# Patient Record
Sex: Male | Born: 2000
Health system: Southern US, Community
[De-identification: ages and names within clinical notes are randomized; demographics above are authoritative.]

---
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
## 2022-03-19 ENCOUNTER — Telehealth: Payer: Self-pay | Admitting: Sports Medicine

## 2022-03-19 NOTE — Telephone Encounter (Signed)
 I saw Harold Whitaker's sister today with his mother, [place] is starting college and will need meningitis B #1, and then meningitis B number two 1 month later, these can be done through a nurse visit. He is up-to-date on meningitis A.

## 2022-03-22 ENCOUNTER — Ambulatory Visit (INDEPENDENT_AMBULATORY_CARE_PROVIDER_SITE_OTHER): Payer: 59 | Admitting: Sports Medicine

## 2022-03-22 VITALS — Temp 98.0°F

## 2022-03-22 DIAGNOSIS — Z23 Encounter for immunization: Secondary | ICD-10-CM

## 2022-03-22 DIAGNOSIS — Z111 Encounter for screening for respiratory tuberculosis: Secondary | ICD-10-CM | POA: Diagnosis not present

## 2022-03-22 NOTE — Progress Notes (Signed)
Pt here for 1st Bexsero and PPD.   Pt tolerated immunization well. He will RTC on Monday to have ppd read and in 1 month for 2nd Bexsero.

## 2022-03-25 ENCOUNTER — Ambulatory Visit (INDEPENDENT_AMBULATORY_CARE_PROVIDER_SITE_OTHER): Payer: 59 | Admitting: Family Medicine

## 2022-03-25 VITALS — BP 120/58 | HR 65 | Ht 70.0 in | Wt 179.0 lb

## 2022-03-25 DIAGNOSIS — Z111 Encounter for screening for respiratory tuberculosis: Secondary | ICD-10-CM

## 2022-03-25 LAB — TB SKIN TEST
Induration: 0 mm
TB Skin Test: NEGATIVE

## 2022-03-25 NOTE — Progress Notes (Unsigned)
   Subjective:    Patient ID: Harold Whitaker, male    DOB: Jul 10, 2001, 21 y.o.   MRN: [id number]  HPI Patient is here for a PPD read.    Review of Systems     Objective:   Physical Exam        Assessment & Plan:  Results were negative with 0 mm induration.

## 2022-03-26 NOTE — Progress Notes (Signed)
Medical screening examination/treatment was performed by qualified clinical staff member and as supervising physician I was immediately available for consultation/collaboration. I have reviewed documentation and agree with assessment and plan.  Derrin Currey, DO  

## 2022-03-27 ENCOUNTER — Ambulatory Visit: Payer: 59

## 2022-04-22 ENCOUNTER — Ambulatory Visit (INDEPENDENT_AMBULATORY_CARE_PROVIDER_SITE_OTHER): Payer: 59 | Admitting: Sports Medicine

## 2022-04-22 VITALS — Temp 98.6°F

## 2022-04-22 DIAGNOSIS — Z23 Encounter for immunization: Secondary | ICD-10-CM

## 2022-04-22 NOTE — Progress Notes (Signed)
   Established Patient Office Visit  Subjective   Patient ID: TATEN MERROW, male    DOB: Jul 28, 2001  Age: 21 y.o. MRN: [id number]  Chief Complaint  Patient presents with   Immunizations    HPI  FELTON BUCZYNSKI is here for last Bexsero vaccine.   ROS    Objective:     Temp 98.6 F (37 C) (Oral)    Physical Exam   No results found for any visits on 04/22/22.    The ASCVD Risk score (Arnett DK, et al., 2019) failed to calculate for the following reasons:   The 2019 ASCVD risk score is only valid for ages 53 to 32    Assessment & Plan:  Immunization - Patient tolerated injection well without complications.    Problem List Items Addressed This Visit   None Visit Diagnoses     Need for meningococcal vaccination    -  Primary   Relevant Orders   Meningococcal B, [facility] (Bexsero) (Completed)       No follow-ups on file.    Esmond Harps, CMA

## 2023-01-06 ENCOUNTER — Encounter: Payer: Self-pay | Admitting: *Deleted

## 2023-02-03 ENCOUNTER — Encounter: Payer: Self-pay | Admitting: Sports Medicine

## 2023-02-05 ENCOUNTER — Ambulatory Visit: Payer: Commercial Managed Care - PPO | Admitting: Sports Medicine

## 2023-02-05 DIAGNOSIS — L03031 Cellulitis of right toe: Secondary | ICD-10-CM

## 2023-02-05 MED ORDER — DOXYCYCLINE HYCLATE 100 MG PO TABS
100.0000 mg | ORAL_TABLET | Freq: Two times a day (BID) | ORAL | 0 refills | Status: AC
Start: 2023-02-05 — End: 2023-02-12

## 2023-02-05 NOTE — Progress Notes (Signed)
    Procedures performed today:    None.  Independent interpretation of notes and tests performed by another provider:   None.  Brief History, Exam, Impression, and Recommendations:    Paronychia of great toe, right Paronychia right great toe, we did an incision today, purulence expressed and sent for culture, adding doxycycline, he will do warm compresses. Return to see me as needed.   ____________________________________________ Ihor Austin. Benjamin Stain, M.D., ABFM., CAQSM., AME. Primary Care and Sports Medicine [facility]  Adjunct Professor of Family Medicine  [employer]  Restaurant manager, fast food

## 2023-02-05 NOTE — Assessment & Plan Note (Signed)
Paronychia right great toe, we did an incision today, purulence expressed and sent for culture, adding doxycycline, he will do warm compresses. Return to see me as needed.

## 2023-02-07 LAB — WOUND CULTURE

## 2023-02-08 LAB — WOUND CULTURE
MICRO NUMBER:: 14960097
SPECIMEN QUALITY:: ADEQUATE

## 2023-02-19 ENCOUNTER — Ambulatory Visit: Payer: Commercial Managed Care - PPO | Admitting: Sports Medicine

## 2023-02-19 DIAGNOSIS — S63642A Sprain of metacarpophalangeal joint of left thumb, initial encounter: Secondary | ICD-10-CM | POA: Diagnosis not present

## 2023-02-19 DIAGNOSIS — L03031 Cellulitis of right toe: Secondary | ICD-10-CM | POA: Diagnosis not present

## 2023-02-19 NOTE — Assessment & Plan Note (Signed)
Doing extremely well approximately 2 weeks status post incision, drainage, antibiotics of a right sided paronychia, return as needed for this. We did discuss cutting the nail straight across and not digging into the lateral nail fold.

## 2023-02-19 NOTE — Progress Notes (Signed)
    Procedures performed today:    None.  Independent interpretation of notes and tests performed by another provider:   None.  Brief History, Exam, Impression, and Recommendations:    Gamekeeper's thumb, left Was doing a back flip at a college event about 3 days ago, landed awkwardly and injured his left thumb, pain is on the ulnar aspect of the first metacarpal phalangeal joint, on exam he has good motion, good strength, he has some tenderness with application of valgus stress but no laxity in the ligament at extension or 30 degrees of flexion.  This is suggestive of a skiers thumb/gamekeeper's thumb but likely only grade 1. We can treat this conservatively, return as needed.  Paronychia of great toe, right Doing extremely well approximately 2 weeks status post incision, drainage, antibiotics of a right sided paronychia, return as needed for this. We did discuss cutting the nail straight across and not digging into the lateral nail fold.    ____________________________________________ Ihor Austin. Benjamin Stain, M.D., ABFM., CAQSM., AME. Primary Care and Sports Medicine [facility]  Adjunct Professor of Family Medicine  [employer]  Restaurant manager, fast food

## 2023-02-19 NOTE — Assessment & Plan Note (Signed)
Was doing a back flip at a college event about 3 days ago, landed awkwardly and injured his left thumb, pain is on the ulnar aspect of the first metacarpal phalangeal joint, on exam he has good motion, good strength, he has some tenderness with application of valgus stress but no laxity in the ligament at extension or 30 degrees of flexion.  This is suggestive of a skiers thumb/gamekeeper's thumb but likely only grade 1. We can treat this conservatively, return as needed.

## 2024-02-08 ENCOUNTER — Telehealth: Admitting: Family

## 2024-02-08 DIAGNOSIS — L255 Unspecified contact dermatitis due to plants, except food: Secondary | ICD-10-CM

## 2024-02-08 MED ORDER — PREDNISONE 10 MG PO TABS
ORAL_TABLET | ORAL | 0 refills | Status: AC
Start: 2024-02-08 — End: ?

## 2024-02-08 MED ORDER — TRIAMCINOLONE ACETONIDE 0.5 % EX OINT
1.0000 | TOPICAL_OINTMENT | Freq: Two times a day (BID) | CUTANEOUS | 0 refills | Status: AC
Start: 2024-02-08 — End: ?

## 2024-02-08 NOTE — Progress Notes (Signed)
 Virtual Visit Consent   [name], you are scheduled for a virtual visit with a [facility] provider today. Just as with appointments in the office, your consent must be obtained to participate. Your consent will be active for this visit and any virtual visit you may have with one of our providers in the next 365 days. If you have a MyChart account, a copy of this consent can be sent to you electronically.  As this is a virtual visit, video technology does not allow for your provider to perform a traditional examination. This may limit your provider's ability to fully assess your condition. If your provider identifies any concerns that need to be evaluated in person or the need to arrange testing (such as labs, EKG, etc.), we will make arrangements to do so. Although advances in technology are sophisticated, we cannot ensure that it will always work on either your end or our end. If the connection with a video visit is poor, the visit may have to be switched to a telephone visit. With either a video or telephone visit, we are not always able to ensure that we have a secure connection.  By engaging in this virtual visit, you consent to the provision of healthcare and authorize for your insurance to be billed (if applicable) for the services provided during this visit. Depending on your insurance coverage, you may receive a charge related to this service.  I need to obtain your verbal consent now. Are you willing to proceed with your visit today? Harold Whitaker has provided verbal consent on 02/08/2024 for a virtual visit (video or telephone). Tommas Fragmin, FNP  Date: 02/08/2024 5:11 PM   Virtual Visit via Video Note   I, Tommas Fragmin, connected with  Harold Whitaker  ([id number], 2001-06-13) on 02/08/24 at  5:15 PM EDT by a video-enabled telemedicine application and verified that I am speaking with the correct person using two identifiers.  Location: Patient: Virtual Visit Location  Patient: Home Provider: Virtual Visit Location Provider: Home Office   I discussed the limitations of evaluation and management by telemedicine and the availability of in person appointments. The patient expressed understanding and agreed to proceed.    History of Present Illness: Harold Whitaker is a 23 y.o. who identifies as a male who was assigned male at birth, and is being seen today for rash from poison ivy. Reports he was doing yard work all weekend.   HPI: Rash This is a new problem. The current episode started in the past 7 days. The problem has been gradually worsening since onset. The affected locations include the head, face, left arm, abdomen, left lower leg, left upper leg, left ankle, right hand, right axilla, right arm, right lower leg and right upper leg. The rash is characterized by redness and itchiness. He was exposed to plant contact.    Problems:  Patient Active Problem List   Diagnosis Date Noted   Gamekeeper's thumb, left 02/19/2023   Paronychia of great toe, right 02/05/2023   Stress fracture of tibia 01/19/2020   Acute neck pain 11/25/2019   Right wrist injury 09/15/2019   Injury of toe, right, initial encounter 08/11/2018   Flexural atopic dermatitis 06/19/2018   Scoliosis 07/08/2017   Pectus carinatum 07/08/2017   Routine sports physical exam 07/08/2017   Poison ivy dermatitis 06/12/2016   Closed nondisplaced fracture of fifth left metatarsal bone 08/24/2015   Tic disorder 07/28/2015   Eczema 05/18/2015    Allergies: No Known  Allergies Medications:  Current Outpatient Medications:    predniSONE  (DELTASONE ) 10 MG tablet, Days 1-4 take 4 tablets (40 mg) daily  Days 5-8 take 3 tablets (30 mg) daily, Days 9-11 take 2 tablets (20 mg) daily, Days 12-14 take 1 tablet (10 mg) daily., Disp: 37 tablet, Rfl: 0   triamcinolone  ointment (KENALOG ) 0.5 %, Apply 1 Application topically 2 (two) times daily., Disp: 30 g, Rfl: 0   Calcium  Carbonate-Vitamin D  600-400  MG-UNIT tablet, Take 1 tablet by mouth 2 (two) times daily., Disp: 60 tablet, Rfl: 11  Observations/Objective: Patient is well-developed, well-nourished in no acute distress.  Resting comfortably  at home.  Head is normocephalic, atraumatic.  No labored breathing.  Speech is clear and coherent with logical content.  Patient is alert and oriented at baseline.  Hives on bilateral cheeks, eyes, neck, bilateral arms, chest, and bilateral legs.    Assessment and Plan: 1. Contact dermatitis due to plant (Primary) - triamcinolone  ointment (KENALOG ) 0.5 %; Apply 1 Application topically 2 (two) times daily.  Dispense: 30 g; Refill: 0 - predniSONE  (DELTASONE ) 10 MG tablet; Days 1-4 take 4 tablets (40 mg) daily  Days 5-8 take 3 tablets (30 mg) daily, Days 9-11 take 2 tablets (20 mg) daily, Days 12-14 take 1 tablet (10 mg) daily.  Dispense: 37 tablet; Refill: 0  Avoid scratching Cool compresses Avoid apply kenalog  cream around eye  Report any s/s of infection  Follow up if symptoms worsen or do not improve   Follow Up Instructions: I discussed the assessment and treatment plan with the patient. The patient was provided an opportunity to ask questions and all were answered. The patient agreed with the plan and demonstrated an understanding of the instructions.  A copy of instructions were sent to the patient via MyChart unless otherwise noted below.     The patient was advised to call back or seek an in-person evaluation if the symptoms worsen or if the condition fails to improve as anticipated.    Tommas Fragmin, FNP

## 2024-05-25 ENCOUNTER — Encounter: Payer: Self-pay | Admitting: Sports Medicine
